# Patient Record
Sex: Female | Born: 1990 | Race: White | Hispanic: No | Marital: Married | Smoking: Never smoker
Health system: Southern US, Community
[De-identification: ages and names within clinical notes are randomized; demographics above are authoritative.]

## PROBLEM LIST (undated history)

## (undated) ENCOUNTER — Inpatient Hospital Stay (HOSPITAL_COMMUNITY): Payer: Self-pay

## (undated) DIAGNOSIS — Z789 Other specified health status: Secondary | ICD-10-CM

## (undated) HISTORY — PX: TONSILLECTOMY: SUR1361

## (undated) HISTORY — PX: DILATION AND CURETTAGE OF UTERUS: SHX78

---

## 2017-10-18 NOTE — L&D Delivery Note (Signed)
Delivery Note At 4:23 PM a viable female was delivered via VBAC, Spontaneous (Presentation: OA to ROT, compound R hand).  APGAR: 9, 9; weight  pending.   Placenta status: S/C/I, Tomasa BlaseSchultz, to patient per request .  Cord:  3VC with the following complications: none .  Cord blood collected for typing.  Anesthesia:  epidural Episiotomy: None Lacerations: Labial Suture Repair: 4.0 vycril Est. Blood Loss (mL): 300  Mom to postpartum.  Baby to Couplet care / Skin to Skin.  Neta MendsDaniela C Paul, CNM 10/04/2018, 4:47 PM

## 2018-03-08 ENCOUNTER — Ambulatory Visit: Payer: Self-pay

## 2018-05-22 LAB — OB RESULTS CONSOLE HIV ANTIBODY (ROUTINE TESTING): HIV: NONREACTIVE

## 2018-05-22 LAB — OB RESULTS CONSOLE GC/CHLAMYDIA
Chlamydia: NEGATIVE
Gonorrhea: NEGATIVE

## 2018-05-22 LAB — OB RESULTS CONSOLE RUBELLA ANTIBODY, IGM: Rubella: IMMUNE

## 2018-05-22 LAB — OB RESULTS CONSOLE HEPATITIS B SURFACE ANTIGEN: Hepatitis B Surface Ag: NEGATIVE

## 2018-05-22 LAB — OB RESULTS CONSOLE RPR: RPR: NONREACTIVE

## 2018-05-29 ENCOUNTER — Other Ambulatory Visit (HOSPITAL_COMMUNITY): Payer: Self-pay | Admitting: Certified Nurse Midwife

## 2018-05-29 DIAGNOSIS — Z3A22 22 weeks gestation of pregnancy: Secondary | ICD-10-CM

## 2018-05-29 DIAGNOSIS — Z3689 Encounter for other specified antenatal screening: Secondary | ICD-10-CM

## 2018-06-01 ENCOUNTER — Encounter (HOSPITAL_COMMUNITY): Payer: Self-pay | Admitting: *Deleted

## 2018-06-05 ENCOUNTER — Other Ambulatory Visit (HOSPITAL_COMMUNITY): Payer: Self-pay | Admitting: Certified Nurse Midwife

## 2018-06-05 ENCOUNTER — Ambulatory Visit (HOSPITAL_COMMUNITY)
Admission: RE | Admit: 2018-06-05 | Discharge: 2018-06-05 | Disposition: A | Discharge status: Home / Self Care | Payer: Medicaid Other | Source: Ambulatory Visit | Attending: Certified Nurse Midwife | Admitting: Certified Nurse Midwife

## 2018-06-05 DIAGNOSIS — Z3A22 22 weeks gestation of pregnancy: Secondary | ICD-10-CM

## 2018-06-05 DIAGNOSIS — O34219 Maternal care for unspecified type scar from previous cesarean delivery: Secondary | ICD-10-CM

## 2018-06-05 DIAGNOSIS — O359XX0 Maternal care for (suspected) fetal abnormality and damage, unspecified, not applicable or unspecified: Secondary | ICD-10-CM | POA: Insufficient documentation

## 2018-06-05 DIAGNOSIS — Z3689 Encounter for other specified antenatal screening: Secondary | ICD-10-CM

## 2018-06-05 DIAGNOSIS — Z363 Encounter for antenatal screening for malformations: Secondary | ICD-10-CM | POA: Diagnosis not present

## 2018-06-10 ENCOUNTER — Other Ambulatory Visit (HOSPITAL_COMMUNITY): Payer: Self-pay

## 2018-06-14 ENCOUNTER — Telehealth (HOSPITAL_COMMUNITY): Payer: Self-pay | Admitting: *Deleted

## 2018-07-31 ENCOUNTER — Other Ambulatory Visit (HOSPITAL_COMMUNITY): Payer: Self-pay | Admitting: Certified Nurse Midwife

## 2018-07-31 DIAGNOSIS — R1013 Epigastric pain: Secondary | ICD-10-CM

## 2018-08-02 ENCOUNTER — Ambulatory Visit (HOSPITAL_COMMUNITY)
Admission: RE | Admit: 2018-08-02 | Discharge: 2018-08-02 | Disposition: A | Discharge status: Home / Self Care | Payer: Medicaid Other | Source: Ambulatory Visit | Attending: Certified Nurse Midwife | Admitting: Certified Nurse Midwife

## 2018-08-02 DIAGNOSIS — Z3A30 30 weeks gestation of pregnancy: Secondary | ICD-10-CM | POA: Insufficient documentation

## 2018-08-02 DIAGNOSIS — R1011 Right upper quadrant pain: Secondary | ICD-10-CM | POA: Diagnosis present

## 2018-08-02 DIAGNOSIS — O9989 Other specified diseases and conditions complicating pregnancy, childbirth and the puerperium: Secondary | ICD-10-CM | POA: Insufficient documentation

## 2018-08-02 DIAGNOSIS — R1013 Epigastric pain: Secondary | ICD-10-CM

## 2018-08-02 DIAGNOSIS — N1330 Unspecified hydronephrosis: Secondary | ICD-10-CM | POA: Diagnosis not present

## 2018-08-02 DIAGNOSIS — O26893 Other specified pregnancy related conditions, third trimester: Secondary | ICD-10-CM | POA: Diagnosis present

## 2018-08-03 ENCOUNTER — Inpatient Hospital Stay (HOSPITAL_COMMUNITY): Payer: Medicaid Other

## 2018-08-03 ENCOUNTER — Inpatient Hospital Stay (HOSPITAL_BASED_OUTPATIENT_CLINIC_OR_DEPARTMENT_OTHER): Payer: Medicaid Other

## 2018-08-03 ENCOUNTER — Encounter (HOSPITAL_COMMUNITY): Payer: Self-pay | Admitting: *Deleted

## 2018-08-03 ENCOUNTER — Inpatient Hospital Stay (HOSPITAL_COMMUNITY)
Admission: AD | Admit: 2018-08-03 | Discharge: 2018-08-03 | Disposition: A | Discharge status: Home / Self Care | Payer: Medicaid Other | Source: Ambulatory Visit | Attending: Obstetrics and Gynecology | Admitting: Obstetrics and Gynecology

## 2018-08-03 DIAGNOSIS — Z3A31 31 weeks gestation of pregnancy: Secondary | ICD-10-CM | POA: Diagnosis not present

## 2018-08-03 DIAGNOSIS — O26893 Other specified pregnancy related conditions, third trimester: Secondary | ICD-10-CM | POA: Insufficient documentation

## 2018-08-03 DIAGNOSIS — Z363 Encounter for antenatal screening for malformations: Secondary | ICD-10-CM

## 2018-08-03 DIAGNOSIS — R1031 Right lower quadrant pain: Secondary | ICD-10-CM | POA: Diagnosis present

## 2018-08-03 DIAGNOSIS — O26899 Other specified pregnancy related conditions, unspecified trimester: Secondary | ICD-10-CM

## 2018-08-03 DIAGNOSIS — R109 Unspecified abdominal pain: Secondary | ICD-10-CM

## 2018-08-03 DIAGNOSIS — O359XX0 Maternal care for (suspected) fetal abnormality and damage, unspecified, not applicable or unspecified: Secondary | ICD-10-CM

## 2018-08-03 DIAGNOSIS — O34219 Maternal care for unspecified type scar from previous cesarean delivery: Secondary | ICD-10-CM | POA: Diagnosis not present

## 2018-08-03 HISTORY — DX: Other specified health status: Z78.9

## 2018-08-03 LAB — COMPREHENSIVE METABOLIC PANEL
ALT: 10 U/L (ref 0–44)
AST: 18 U/L (ref 15–41)
Albumin: 3.2 g/dL — ABNORMAL LOW (ref 3.5–5.0)
Alkaline Phosphatase: 58 U/L (ref 38–126)
Anion gap: 8 (ref 5–15)
BUN: 5 mg/dL — ABNORMAL LOW (ref 6–20)
CO2: 21 mmol/L — ABNORMAL LOW (ref 22–32)
Calcium: 8.6 mg/dL — ABNORMAL LOW (ref 8.9–10.3)
Chloride: 105 mmol/L (ref 98–111)
Creatinine, Ser: 0.4 mg/dL — ABNORMAL LOW (ref 0.44–1.00)
GFR calc Af Amer: >60 mL/min (ref >60)
GFR calc non Af Amer: >60 mL/min (ref >60)
Glucose, Bld: 84 mg/dL (ref 70–99)
Potassium: 4 mmol/L (ref 3.5–5.1)
Sodium: 134 mmol/L — ABNORMAL LOW (ref 135–145)
Total Bilirubin: 0.9 mg/dL (ref 0.3–1.2)
Total Protein: 6.6 g/dL (ref 6.5–8.1)

## 2018-08-03 LAB — CBC WITH DIFFERENTIAL/PLATELET
Basophils Absolute: 0 10*3/uL (ref 0.0–0.1)
Basophils Relative: 0 %
Eosinophils Absolute: 0 10*3/uL (ref 0.0–0.5)
Eosinophils Relative: 0 %
HCT: 32.8 % — ABNORMAL LOW (ref 36.0–46.0)
Hemoglobin: 11.5 g/dL — ABNORMAL LOW (ref 12.0–15.0)
Lymphocytes Relative: 12 %
Lymphs Abs: 1 10*3/uL (ref 0.7–4.0)
MCH: 31.7 pg (ref 26.0–34.0)
MCHC: 35.1 g/dL (ref 30.0–36.0)
MCV: 90.4 fL (ref 80.0–100.0)
Monocytes Absolute: 0.3 10*3/uL (ref 0.1–1.0)
Monocytes Relative: 3 %
Neutro Abs: 6.5 10*3/uL (ref 1.7–7.7)
Neutrophils Relative %: 85 %
Platelets: 208 10*3/uL (ref 150–400)
RBC: 3.63 MIL/uL — ABNORMAL LOW (ref 3.87–5.11)
RDW: 13.6 % (ref 11.5–15.5)
WBC: 7.7 10*3/uL (ref 4.0–10.5)
nRBC: 0 % (ref 0.0–0.2)

## 2018-08-03 MED ORDER — LACTATED RINGERS IV BOLUS
250.0000 mL | Freq: Once | INTRAVENOUS | Status: AC
  Administered 2018-08-03: 250 mL via INTRAVENOUS

## 2018-08-03 MED ORDER — NALBUPHINE HCL 10 MG/ML IJ SOLN
10.0000 mg | INTRAMUSCULAR | Status: AC
  Administered 2018-08-03: 10 mg via INTRAVENOUS
  Filled 2018-08-03: qty 1

## 2018-08-03 MED ORDER — LACTATED RINGERS IV SOLN
INTRAVENOUS | Status: DC
  Administered 2018-08-03: 13:00:00 via INTRAVENOUS

## 2018-08-03 MED ORDER — GI COCKTAIL ~~LOC~~
30.0000 mL | Freq: Once | ORAL | Status: AC
  Administered 2018-08-03: 30 mL via ORAL
  Filled 2018-08-03: qty 30

## 2018-08-03 NOTE — Discharge Instructions (Signed)

## 2018-08-03 NOTE — Progress Notes (Signed)
S:  no pain x 2 hours after IV meds while still       recurrence of intense pain with standing- walking - siting on toilet      no nausea, vomiting, epigastric pain        O:  VS: not documented by staff        FHR : baseline 135 / variability moderate / accelerations + / no decelerations        Toco: no UI / occasional rare ctx        Cervix : deferred        Membranes: intact        OB-sono: vtx / AFI 20 / normal placenta  (previous transverse lie last office visit)        Renal sono: right moderate hydronephrosis likely gravid uterus contributing - no back pain / no stones seen  A: 31 weeks abdominal pain - pain most consistent with ligament pain      no evidence of pre-term labor or abruption or gallbladder etiology today      FHR category 1 - OB sono normal AFI / vtx presentation      right hydronephrosis - pregnancy contributing factor / can get outpatient renal consult if pain persists      no evidence of appendicitis - cannot completely exclude but no clear indication for CT & risk 3rd trimester  P: DC home - expectant management / guarded condition      complete IVF and repeat Nubain 10IV prior to DC (mother to drive home and remain with her tonight)      recheck status in am - instruction for sleep with feet elevated to reduce ligament pain tonight      consider renal consult and/ or CT based on status in next 24 hours      Dr Billy Coast updated           Jennifer Dalton CNM, MSN, [facility] 08/03/2018, 2:42 PM

## 2018-08-03 NOTE — MAU Provider Note (Signed)
History     CSN: [id number]  Arrival date and time: 08/03/18 1110 TC to provider @ 1155am Provider in room to evaluate @ 12:10   HPI  Acute abdominal pain over entire abdomen today that hurts to touch her belly. States dropped to floor trying to walk due to severe pain right side and lower abdomen when stood up this am. Reports fetal movement and position changes hurt -only place comfortable lying down & pain stops with still. No nausea or vomiting with normal BM today. Normal low fat diet intake without symptoms. Spouse out of town - called EMS for transport due to pain with walking  Monday: Intermittent epigastric pain - seen for possible acute gallbladder pain onset after 2 hours high fat food intake - epigastric radiating to back unable to lie down & Tums did not resolve (+) nausea but no vomiting reports normal BM - no diarrhea or heartburn over weekend.  Seen and examined and additional labs with mildly elevated amylase with NL LE and lipase         scheduled for outpt GI sono 10/16:  normal upper GI sono yesterday with mild right pylectasis   Past Medical History:  Diagnosis Date  . Medical history non-contributory     Past Surgical History:  Procedure Laterality Date  . DILATION AND CURETTAGE OF UTERUS    . TONSILLECTOMY      No family history on file.  Social History   Tobacco Use  . Smoking status: Never Smoker  . Smokeless tobacco: Never Used  Substance Use Topics  . Alcohol use: Not Currently  . Drug use: Never    Allergies: No Known Allergies  No medications prior to admission.    Review of Systems Physical Exam   Blood pressure 130/61, pulse (!) 105, temperature 98.1 F (36.7 C), resp. rate 18, last menstrual period 12/29/2017.  Physical Exam  Alert and oriented - acute pain and tearful in bed Heart RRR with mild tachycardia Lungs clear Abdomen (+) BS / gravid uterus - non-tender but displacement of uterus causes generalized abdominal  pain / no specific rebound / generalized tenderness to abdomen but increased over right lower quadrant. Defer VE - no discharge / no ctx / normal FM CVA - negative bilateral Extremities - no edema or pain  FHR - reactive NST with category 1 tracing Toco occasional ctx   Labs:  9/26 WBC 8.0  and 10/15 WBC 10.3  - Today WBC 7.7  10/15  LE 11/14 and amylase 37 - today LE 18/10  MAU Course  Procedures  Renal sono to evaluate hydronephrosis and nephrolithiasis Ob sono for presentation / AFI may need CT to evaluate appendix with increased WBC or worsening symptoms with other negative testing  Will consult with MD after initial labs & imaging resulted / reviewed   Assessment and Plan  [redacted] weeks gestation with abdominal pain - diffuse with increase right lower quadrant pain recent gallbladder symptoms (Monday)            - GI sono NL without stones / normal liver functions with mildly elevated amylase  Etiology of pain:    - renal: evaluate for hydronephrosis & nephrolithiasis with sono / negative flank pain and clear urine   - cannot exclude appendix - afebrile with normal WBC and normal bowel function   - possible ligament pain from fetal presentation or change in presentation (transverse earlier in week)  Orders: CBC with diff / CMP Renal sono  OB sono AFI  and presentation IVF for hydration GI cocktail and Nubain for pain  Marlinda Mike 08/03/2018, 12:19 PM

## 2018-08-03 NOTE — MAU Note (Signed)
Pt arrived EMS with diffuse abd pain that has gotten worse over the past few days. Abd is tender to touch. FHR WNL at this time.

## 2018-09-04 LAB — OB RESULTS CONSOLE GBS: GBS: NEGATIVE

## 2018-10-04 ENCOUNTER — Inpatient Hospital Stay (HOSPITAL_COMMUNITY): Payer: Medicaid Other | Admitting: Anesthesiology

## 2018-10-04 ENCOUNTER — Inpatient Hospital Stay (HOSPITAL_COMMUNITY)
Admission: AD | Admit: 2018-10-04 | Discharge: 2018-10-05 | DRG: 807 | Disposition: A | Discharge status: Home / Self Care | Payer: Medicaid Other | Attending: Obstetrics and Gynecology | Admitting: Obstetrics and Gynecology

## 2018-10-04 ENCOUNTER — Other Ambulatory Visit: Payer: Self-pay

## 2018-10-04 ENCOUNTER — Encounter (HOSPITAL_COMMUNITY): Payer: Self-pay

## 2018-10-04 DIAGNOSIS — O9902 Anemia complicating childbirth: Secondary | ICD-10-CM | POA: Diagnosis present

## 2018-10-04 DIAGNOSIS — Z3A39 39 weeks gestation of pregnancy: Secondary | ICD-10-CM | POA: Diagnosis not present

## 2018-10-04 DIAGNOSIS — O34219 Maternal care for unspecified type scar from previous cesarean delivery: Principal | ICD-10-CM | POA: Diagnosis present

## 2018-10-04 DIAGNOSIS — Z3483 Encounter for supervision of other normal pregnancy, third trimester: Secondary | ICD-10-CM | POA: Diagnosis present

## 2018-10-04 DIAGNOSIS — D649 Anemia, unspecified: Secondary | ICD-10-CM | POA: Diagnosis present

## 2018-10-04 DIAGNOSIS — Z3490 Encounter for supervision of normal pregnancy, unspecified, unspecified trimester: Secondary | ICD-10-CM

## 2018-10-04 LAB — CBC
HCT: 35.1 % — ABNORMAL LOW (ref 36.0–46.0)
Hemoglobin: 11.8 g/dL — ABNORMAL LOW (ref 12.0–15.0)
MCH: 31.1 pg (ref 26.0–34.0)
MCHC: 33.6 g/dL (ref 30.0–36.0)
MCV: 92.4 fL (ref 80.0–100.0)
NRBC: 0 % (ref 0.0–0.2)
Platelets: 186 10*3/uL (ref 150–400)
RBC: 3.8 MIL/uL — ABNORMAL LOW (ref 3.87–5.11)
RDW: 13.5 % (ref 11.5–15.5)
WBC: 9.6 10*3/uL (ref 4.0–10.5)

## 2018-10-04 LAB — ABO/RH: ABO/RH(D): A POS

## 2018-10-04 LAB — TYPE AND SCREEN
ABO/RH(D): A POS
Antibody Screen: NEGATIVE

## 2018-10-04 MED ORDER — OXYTOCIN 40 UNITS IN LACTATED RINGERS INFUSION - SIMPLE MED
2.5000 [IU]/h | INTRAVENOUS | Status: DC
  Filled 2018-10-04: qty 1000

## 2018-10-04 MED ORDER — DIPHENHYDRAMINE HCL 50 MG/ML IJ SOLN: 12.5000 mg | INTRAMUSCULAR | Status: DC | PRN

## 2018-10-04 MED ORDER — WITCH HAZEL-GLYCERIN EX PADS: 1.0000 "application " | MEDICATED_PAD | CUTANEOUS | Status: DC | PRN

## 2018-10-04 MED ORDER — DOCUSATE SODIUM 100 MG PO CAPS
100.0000 mg | ORAL_CAPSULE | Freq: Two times a day (BID) | ORAL | Status: DC
  Administered 2018-10-05: 100 mg via ORAL
  Filled 2018-10-04: qty 1

## 2018-10-04 MED ORDER — FENTANYL 2.5 MCG/ML BUPIVACAINE 1/10 % EPIDURAL INFUSION (WH - ANES)
14.0000 mL/h | INTRAMUSCULAR | Status: DC | PRN
  Administered 2018-10-04: 14 mL/h via EPIDURAL
  Filled 2018-10-04: qty 100

## 2018-10-04 MED ORDER — IBUPROFEN 600 MG PO TABS
600.0000 mg | ORAL_TABLET | Freq: Four times a day (QID) | ORAL | Status: DC
  Administered 2018-10-04 – 2018-10-05 (×4): 600 mg via ORAL
  Filled 2018-10-04 (×4): qty 1

## 2018-10-04 MED ORDER — TETANUS-DIPHTH-ACELL PERTUSSIS 5-2.5-18.5 LF-MCG/0.5 IM SUSP: 0.5000 mL | Freq: Once | INTRAMUSCULAR | Status: DC

## 2018-10-04 MED ORDER — DIBUCAINE 1 % RE OINT: 1.0000 "application " | TOPICAL_OINTMENT | RECTAL | Status: DC | PRN

## 2018-10-04 MED ORDER — PRENATAL MULTIVITAMIN CH
1.0000 | ORAL_TABLET | Freq: Every day | ORAL | Status: DC
  Administered 2018-10-05: 1 via ORAL
  Filled 2018-10-04: qty 1

## 2018-10-04 MED ORDER — DIPHENHYDRAMINE HCL 25 MG PO CAPS: 25.0000 mg | ORAL_CAPSULE | Freq: Four times a day (QID) | ORAL | Status: DC | PRN

## 2018-10-04 MED ORDER — ONDANSETRON HCL 4 MG PO TABS: 4.0000 mg | ORAL_TABLET | ORAL | Status: DC | PRN

## 2018-10-04 MED ORDER — SOD CITRATE-CITRIC ACID 500-334 MG/5ML PO SOLN: 30.0000 mL | ORAL | Status: DC | PRN

## 2018-10-04 MED ORDER — BENZOCAINE-MENTHOL 20-0.5 % EX AERO
1.0000 "application " | INHALATION_SPRAY | CUTANEOUS | Status: DC | PRN
  Administered 2018-10-04: 1 via TOPICAL
  Filled 2018-10-04: qty 56

## 2018-10-04 MED ORDER — LACTATED RINGERS IV SOLN: 500.0000 mL | Freq: Once | INTRAVENOUS | Status: DC

## 2018-10-04 MED ORDER — OXYCODONE-ACETAMINOPHEN 5-325 MG PO TABS: 1.0000 | ORAL_TABLET | ORAL | Status: DC | PRN

## 2018-10-04 MED ORDER — OXYCODONE HCL 5 MG PO TABS: 5.0000 mg | ORAL_TABLET | ORAL | Status: DC | PRN

## 2018-10-04 MED ORDER — LACTATED RINGERS IV SOLN
INTRAVENOUS | Status: DC
  Administered 2018-10-04: 14:00:00 via INTRAVENOUS

## 2018-10-04 MED ORDER — FLEET ENEMA 7-19 GM/118ML RE ENEM: 1.0000 | ENEMA | Freq: Every day | RECTAL | Status: DC | PRN

## 2018-10-04 MED ORDER — EPHEDRINE 5 MG/ML INJ
10.0000 mg | INTRAVENOUS | Status: DC | PRN
  Filled 2018-10-04: qty 2

## 2018-10-04 MED ORDER — BISACODYL 10 MG RE SUPP: 10.0000 mg | Freq: Every day | RECTAL | Status: DC | PRN

## 2018-10-04 MED ORDER — ONDANSETRON HCL 4 MG/2ML IJ SOLN: 4.0000 mg | INTRAMUSCULAR | Status: DC | PRN

## 2018-10-04 MED ORDER — PHENYLEPHRINE 40 MCG/ML (10ML) SYRINGE FOR IV PUSH (FOR BLOOD PRESSURE SUPPORT)
80.0000 ug | PREFILLED_SYRINGE | INTRAVENOUS | Status: DC | PRN
  Filled 2018-10-04 (×2): qty 10

## 2018-10-04 MED ORDER — ACETAMINOPHEN 325 MG PO TABS: 650.0000 mg | ORAL_TABLET | ORAL | Status: DC | PRN

## 2018-10-04 MED ORDER — ONDANSETRON HCL 4 MG/2ML IJ SOLN: 4.0000 mg | Freq: Four times a day (QID) | INTRAMUSCULAR | Status: DC | PRN

## 2018-10-04 MED ORDER — OXYTOCIN BOLUS FROM INFUSION
500.0000 mL | Freq: Once | INTRAVENOUS | Status: AC
  Administered 2018-10-04: 500 mL via INTRAVENOUS

## 2018-10-04 MED ORDER — LIDOCAINE HCL (PF) 1 % IJ SOLN
INTRAMUSCULAR | Status: DC | PRN
  Administered 2018-10-04 (×2): 4 mL via EPIDURAL

## 2018-10-04 MED ORDER — COCONUT OIL OIL: 1.0000 "application " | TOPICAL_OIL | Status: DC | PRN

## 2018-10-04 MED ORDER — PHENYLEPHRINE 40 MCG/ML (10ML) SYRINGE FOR IV PUSH (FOR BLOOD PRESSURE SUPPORT)
80.0000 ug | PREFILLED_SYRINGE | INTRAVENOUS | Status: DC | PRN
  Filled 2018-10-04: qty 10

## 2018-10-04 MED ORDER — SIMETHICONE 80 MG PO CHEW
80.0000 mg | CHEWABLE_TABLET | ORAL | Status: DC | PRN
  Administered 2018-10-05: 80 mg via ORAL
  Filled 2018-10-04: qty 1

## 2018-10-04 MED ORDER — OXYCODONE-ACETAMINOPHEN 5-325 MG PO TABS: 2.0000 | ORAL_TABLET | ORAL | Status: DC | PRN

## 2018-10-04 MED ORDER — LIDOCAINE HCL (PF) 1 % IJ SOLN
30.0000 mL | INTRAMUSCULAR | Status: DC | PRN
  Filled 2018-10-04: qty 30

## 2018-10-04 NOTE — H&P (Signed)
OB ADMISSION/ HISTORY & PHYSICAL:  Admission Date: 10/04/2018  7:57 AM  Admit Diagnosis: 40wks induction    Jennifer Dalton is a 27 y.o. female presenting for IOL, elective, wants to deliver while spouse is in town. Cervical balloon in office yesterday, expelled after 4 hours, patient slept overnight, occasional ctx.  Took one dose castor oil this am.  Doing well, no NVD, comfortable.   Prenatal History: [id number]   EDC : 10/05/2018, by Last Menstrual Period  [facility] since 20 wks, [place]  Prenatal course complicated by: Hx macrosomia (9'15) then 1C/S for suspect macrosomia (8'13) Abdominal pain at 35 wks, suspect gall bladder colic, labs and sono benign, no further episodes with change in diet.   Prenatal Labs: ABO, Rh:   A pos Antibody:  neg Rubella:   immune RPR:   NR HBsAg:   NR HIV:   NR GBS:   neg 1 hr Glucola : wnl Genetic Screening: NIPS normal Ultrasound: normal anatomy, undisclosed gender, EIF, anterior placenta  Medical / Surgical History :  Past medical history:  Past Medical History:  Diagnosis Date  . Medical history non-contributory      Past surgical history:  Past Surgical History:  Procedure Laterality Date  . CESAREAN SECTION    . DILATION AND CURETTAGE OF UTERUS    . TONSILLECTOMY       Family History: History reviewed. No pertinent family history.   Social History:  reports that she has never smoked. She has never used smokeless tobacco. She reports previous alcohol use. She reports that she does not use drugs.   Allergies: Patient has no known allergies.   Current Medications at time of admission:  Medications Prior to Admission  Medication Sig Dispense Refill Last Dose  . OVER THE COUNTER MEDICATION Take 3 tablets by mouth daily. [employer] My Kind Prenatal Vitamin   10/04/2018 at Unknown time     Review of Systems: ROS  As noted above  Physical Exam: Vital signs and nursing  notes reviewed.  ED Triage Vitals  Enc Vitals Group     BP 10/04/18 0820 122/65     Pulse Rate 10/04/18 0820 (!) 124     Resp 10/04/18 0820 18     Temp --      Temp src --      SpO2 --      Weight 10/04/18 0857 183 lb 6.4 oz (83.2 kg)     Height 10/04/18 0857 5\' 7"  (1.702 m)     Head Circumference --      Peak Flow --      Pain Score --      Pain Loc --      Pain Edu? --      Excl. in GC? --      General: AAO x 3, NAD, coping well Heart: RRR Lungs:CTAB Abdomen: Gravid, NT, Leopold's vtx, EFW 8 lbs Extremities: no edema Genitalia / VE: Dilation: 5 Effacement (%): 90 Presentation: Vertex Exam by:: D.  CNM  AROM, light MSAF FHR:  140 BPM, mod variability, + accels, no decels TOCO: Ctx q occasional  Labs:   Pending T&S, CBC, RPR  Recent Labs    10/04/18 0859  WBC 9.6  HGB 11.8*  HCT 35.1*  PLT 186       Assessment:  27 y.o. [id number] at 1290w6d, TOLAC, elective IOL HX LGA, pelvis proven to 9'15"   1. S/p cervical balloon outpatient, favorable cvx 2.  FHR category 1 3. GBS neg 4. Desires natural labor, minimal intervention 5. Breastfeeding 6. Placenta disposal per patient request  Plan:  1. Admit to BS, AROM augmentation, plan Pitocin if no labor in next couple hours 2. Routine L&D orders 3. Nitrous PRN  4. Expectant management 5. Anticipate NSVB   Dr Billy Coast notified of admission / plan of care   Neta Mends CNM, MSN 10/04/2018, 9:47 AM

## 2018-10-04 NOTE — Anesthesia Procedure Notes (Signed)
Epidural Patient location during procedure: OB Start time: 10/04/2018 2:48 PM End time: 10/04/2018 2:57 PM  Staffing Anesthesiologist: Mal AmabileFoster, Vicki Chaffin, MD Performed: anesthesiologist   Preanesthetic Checklist Completed: patient identified, site marked, surgical consent, pre-op evaluation, timeout performed, IV checked, risks and benefits discussed and monitors and equipment checked  Epidural Patient position: sitting Prep: site prepped and draped and DuraPrep Patient monitoring: continuous pulse ox and blood pressure Approach: midline Location: L3-L4 Injection technique: LOR air  Needle:  Needle type: Tuohy  Needle gauge: 17 G Needle length: 9 cm and 9 Needle insertion depth: 4 cm Catheter type: closed end flexible Catheter size: 19 Gauge Catheter at skin depth: 9 cm Test dose: negative and Other  Assessment Events: blood not aspirated, injection not painful, no injection resistance, negative IV test and no paresthesia  Additional Notes Patient identified. Risks and benefits discussed including failed block, incomplete  Pain control, post dural puncture headache, nerve damage, paralysis, blood pressure Changes, nausea, vomiting, reactions to medications-both toxic and allergic and post Partum back pain. All questions were answered. Patient expressed understanding and wished to proceed. Sterile technique was used throughout procedure. Epidural site was Dressed with sterile barrier dressing. No paresthesias, signs of intravascular injection Or signs of intrathecal spread were encountered.  Patient was more comfortable after the epidural was dosed. Please see RN's note for documentation of vital signs and FHR which are stable. Reason for block:procedure for pain

## 2018-10-04 NOTE — Anesthesia Preprocedure Evaluation (Signed)
Anesthesia Evaluation  Patient identified by MRN, date of birth, ID band Patient awake    Reviewed: Allergy & Precautions, Patient's Chart, lab work & pertinent test results  Airway Mallampati: II  TM Distance: >3 FB Neck ROM: Full    Dental no notable dental hx. (+) Teeth Intact   Pulmonary neg pulmonary ROS,    Pulmonary exam normal breath sounds clear to auscultation       Cardiovascular negative cardio ROS Normal cardiovascular exam Rhythm:Regular Rate:Normal     Neuro/Psych negative neurological ROS     GI/Hepatic Neg liver ROS, GERD  ,  Endo/Other  negative endocrine ROS  Renal/GU negative Renal ROS     Musculoskeletal   Abdominal   Peds  Hematology  (+) anemia ,   Anesthesia Other Findings   Reproductive/Obstetrics (+) Pregnancy Previous C/section                             Anesthesia Physical Anesthesia Plan  ASA: II  Anesthesia Plan: Epidural   Post-op Pain Management:    Induction:   PONV Risk Score and Plan:   Airway Management Planned: Natural Airway  Additional Equipment:   Intra-op Plan:   Post-operative Plan:   Informed Consent: I have reviewed the patients History and Physical, chart, labs and discussed the procedure including the risks, benefits and alternatives for the proposed anesthesia with the patient or authorized representative who has indicated his/her understanding and acceptance.     Plan Discussed with: Anesthesiologist  Anesthesia Plan Comments:         Anesthesia Quick Evaluation

## 2018-10-04 NOTE — Progress Notes (Signed)
Patient ID: Jennifer Dalton Sahagun, female   DOB: 01/26/1991, 27 y.o.   MRN: [id number] Jennifer Dalton Whatley is a 27 y.o. [id number] at 4134w6d by LMP admitted for IOL/TOLAC at term, favorable.  Subjective:  Active labor started after AROM, has had BM x 4, no N/V. Used position changes and birthing ball, nitrous gas briefly, just completed epidural. Getting more comfortable but still feeling a lot of pressure.   Objective: Vitals:   10/04/18 1505 10/04/18 1510 10/04/18 1515 10/04/18 1520  BP: 113/66 116/75 108/70 112/65  Pulse: 88 81 93 84  Resp: 20  18 18   Temp:      TempSrc:      SpO2: 100% 100% 100% 100%  Weight:      Height:        FHT:  FHR: 130 bpm, variability: moderate,  accelerations:  Present,  decelerations:  Present some early UC:   q 2-4 min, mod to palp SVE:   Dilation: 9 Effacement (%): 90 Station: -1 Exam by:: Colon Flattery. Luverta Korte CNM Needs to rotate, asynclitic   Labs:   Recent Labs    10/04/18 0859  WBC 9.6  HGB 11.8*  HCT 35.1*  PLT 186    Assessment / Plan: Spontaneous labor, progressing normally  TOLAC  Labor: Anticipate active second stage shortly Preeclampsia:  no s/sx Fetal Wellbeing:  Category I Pain Control:  Epidural and suboptimal pain cotrol I/D:  GBS neg Anticipated MOD:  VBAC  Neta Mendsaniela C Takeem Krotzer, CNM, MSN 10/04/2018, 3:27 PM

## 2018-10-04 NOTE — Anesthesia Pain Management Evaluation Note (Signed)
  CRNA Pain Management Visit Note  Patient: Randa EvensCarissa Roscoe, 27 y.o., female  "Hello I am a member of the anesthesia team at [facility]. We have an anesthesia team available at all times to provide care throughout the hospital, including epidural management and anesthesia for C-section. I don't know your plan for the delivery whether it a natural birth, water birth, IV sedation, nitrous supplementation, doula or epidural, but we want to meet your pain goals."   1.Was your pain managed to your expectations on prior hospitalizations?   Yes   2.What is your expectation for pain management during this hospitalization?     Labor support without medications  3.How can we help you reach that goal? Natural or IV meds as requested  Record the patient's initial score and the patient's pain goal.   Pain: 4  Pain Goal: 10 [facility] wants you to be able to say your pain was always managed very well.  Cleda ClarksBrowder, Natascha Edmonds R 10/04/2018

## 2018-10-05 LAB — CBC
HCT: 33.7 % — ABNORMAL LOW (ref 36.0–46.0)
Hemoglobin: 11.4 g/dL — ABNORMAL LOW (ref 12.0–15.0)
MCH: 31.2 pg (ref 26.0–34.0)
MCHC: 33.8 g/dL (ref 30.0–36.0)
MCV: 92.3 fL (ref 80.0–100.0)
Platelets: 214 10*3/uL (ref 150–400)
RBC: 3.65 MIL/uL — ABNORMAL LOW (ref 3.87–5.11)
RDW: 13.7 % (ref 11.5–15.5)
WBC: 13.6 10*3/uL — ABNORMAL HIGH (ref 4.0–10.5)
nRBC: 0 % (ref 0.0–0.2)

## 2018-10-05 LAB — RPR: RPR Ser Ql: NONREACTIVE

## 2018-10-05 MED ORDER — IBUPROFEN 600 MG PO TABS: 600.0000 mg | ORAL_TABLET | Freq: Four times a day (QID) | ORAL | 0 refills | Status: AC

## 2018-10-05 NOTE — Progress Notes (Signed)
PPD 1 SVD with VBAC  S:  Reports feeling well - desires to go home             Tolerating po/ No nausea or vomiting             Bleeding is light             Pain controlled with motrin             Up ad lib / ambulatory / voiding QS  Newborn Breast  O:            VS: BP 114/73 (BP Location: Left Arm)   Pulse 79   Temp 97.7 F (36.5 C) (Oral)   Resp 17   Ht 5\' 7"  (1.702 m)   Wt 83.2 kg   LMP 12/29/2017   SpO2 100%   Breastfeeding Unknown   BMI 28.72 kg/m    LABS:             Recent Labs    10/04/18 0859 10/05/18 0612  WBC 9.6 13.6*  HGB 11.8* 11.4*  PLT 186 214               Blood type: --/--/A POS, A POS Performed at [facility], [address] [place], [place] [address]  [phone](12/18 [phone])  Rubella: Immune (08/05 0000)                    Physical Exam:             Alert and oriented X3             Fundus: firm, non-tender, Ueven  Perineum: intact  Lochia: light  Extremities: no edema, no calf pain or tenderness    A: PPD # 1 VBAC  Doing well - stable status  P: Routine post partum orders  DC home  Marlinda Mikeanya Yonah Tangeman CNM, MSN, [facility] 10/05/2018, 6:52 PM

## 2018-10-05 NOTE — Anesthesia Postprocedure Evaluation (Signed)
Anesthesia Post Note  Patient: Randa Evensarissa Demaree  Procedure(s) Performed: AN AD HOC LABOR EPIDURAL     Patient location during evaluation: Mother Baby Anesthesia Type: Epidural Level of consciousness: awake and alert Pain management: pain level controlled Vital Signs Assessment: post-procedure vital signs reviewed and stable Respiratory status: spontaneous breathing, nonlabored ventilation and respiratory function stable Cardiovascular status: stable Postop Assessment: no headache, no backache and epidural receding Anesthetic complications: no    Last Vitals:  Vitals:   10/05/18 0406 10/05/18 0524  BP: 111/65 117/68  Pulse: 71 67  Resp: 16 16  Temp: 36.7 C 36.6 C  SpO2: 100% 100%    Last Pain:  Vitals:   10/05/18 0524  TempSrc: Oral  PainSc:    Pain Goal:                 Junious SilkGILBERT,Blade Scheff

## 2018-10-05 NOTE — Discharge Summary (Signed)
Obstetric Discharge Summary Reason for Admission: induction of labor Prenatal Procedures: none Intrapartum Procedures: spontaneous vaginal delivery Postpartum Procedures: none Complications-Operative and Postpartum: none Hemoglobin  Date Value Ref Range Status  10/05/2018 11.4 (L) 12.0 - 15.0 g/dL Final   HCT  Date Value Ref Range Status  10/05/2018 33.7 (L) 36.0 - 46.0 % Final    Physical Exam:  General: alert, cooperative and no distress Lochia: appropriate Uterine Fundus: firm DVT Evaluation: No evidence of DVT seen on physical exam.  Discharge Diagnoses: Term Pregnancy-delivered and VBAC  Discharge Information: Date: 10/05/2018 Activity: pelvic rest Diet: routine Medications: PNV and Ibuprofen Condition: stable Instructions: refer to practice specific booklet Discharge to: home Follow-up Information    Neta Mendsaul, Daniela C, CNM. Schedule an appointment as soon as possible for a visit in 2 week(s).   Specialty:  Obstetrics and Gynecology Contact information: [address] [place] [address] [phone]           Newborn Data: Live born female  Birth Weight: 8 lb 8.2 oz (3861 g) APGAR: 9, 9  Newborn Delivery   Birth date/time:  10/04/2018 16:23:00 Delivery type:  VBAC, Spontaneous     Home with mother.  Jennifer Dalton 10/05/2018, 6:54 PM

## 2020-05-29 IMAGING — US US ABDOMEN LIMITED
1 series · 14 of 25 positions shown · non-contrast
Comparison: None.

CLINICAL DATA: Abdominal pain, 31 weeks pregnant

EXAM:
ULTRASOUND ABDOMEN LIMITED RIGHT UPPER QUADRANT

[Series 1: us abdomen limited · 14 of 46 slices shown]
[im 1/46]
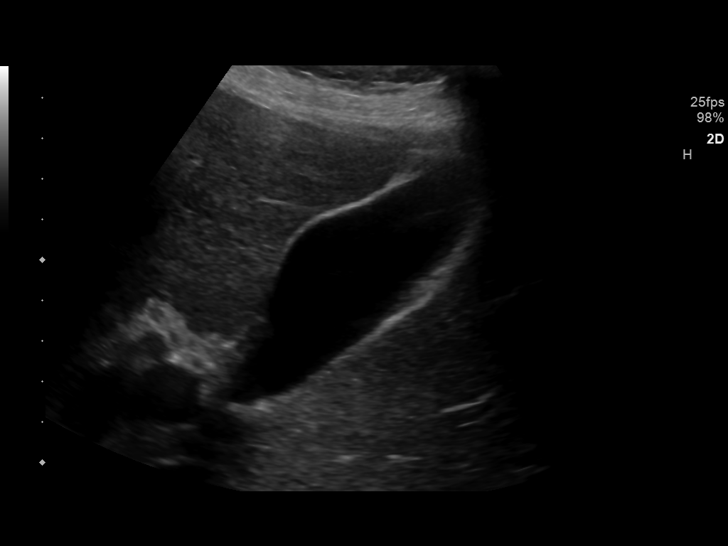
[im 4/46]
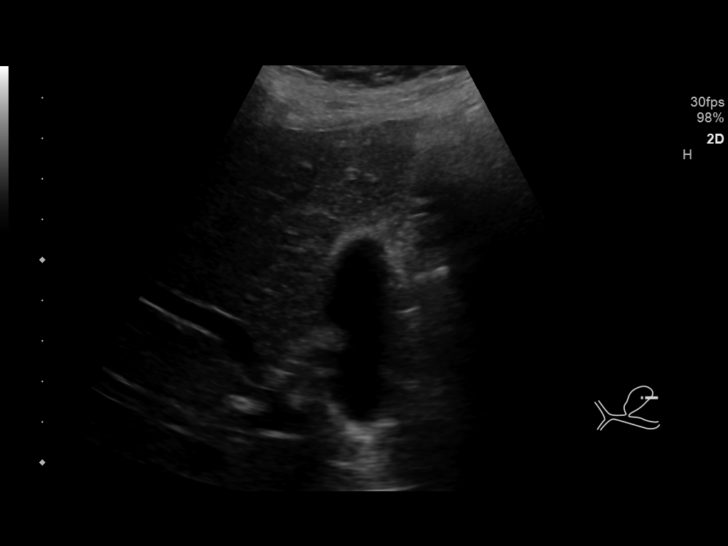
[im 8/46]
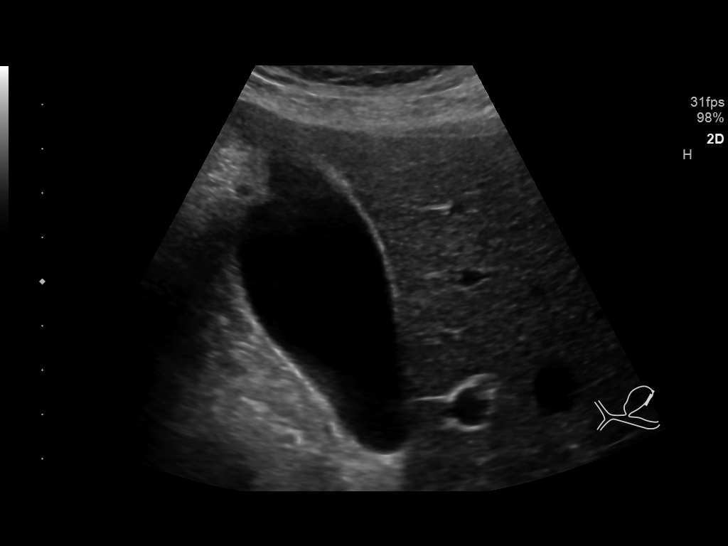
[im 12/46]
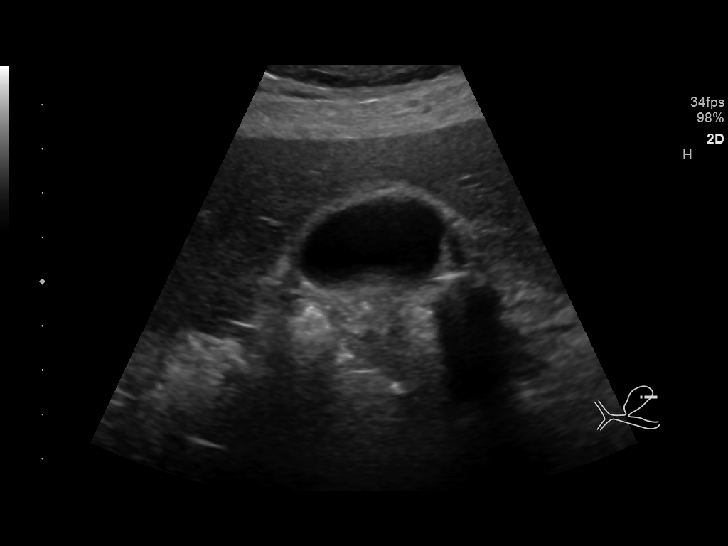
[im 16/46]
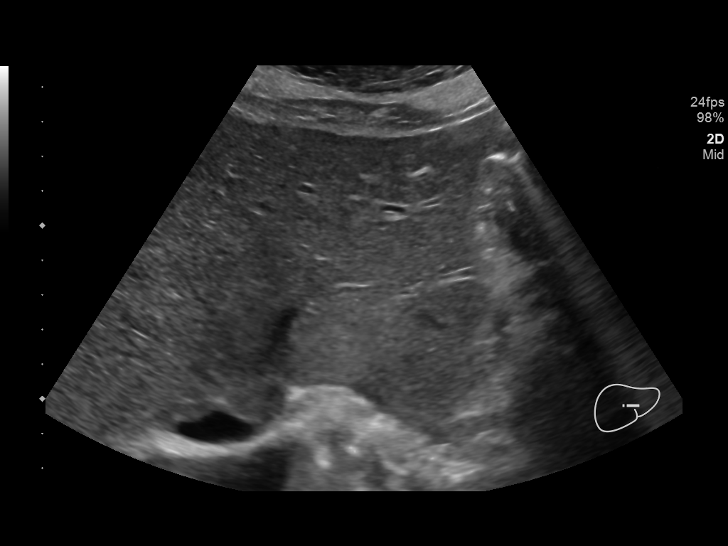
[im 17/46]
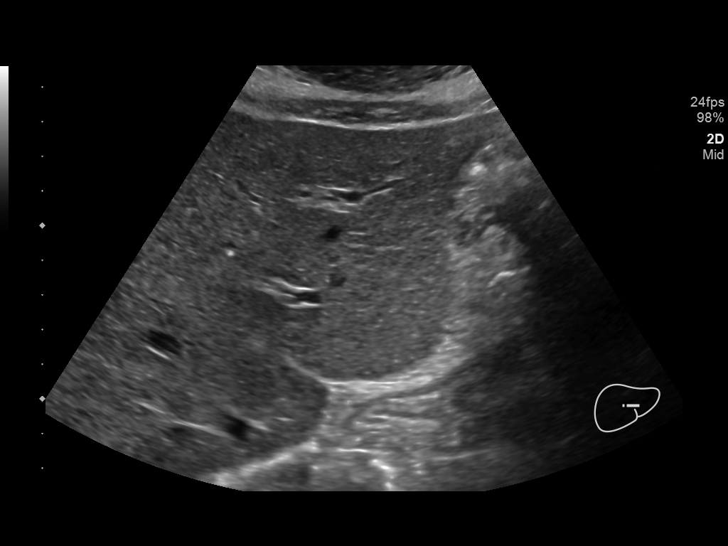
[im 21/46]
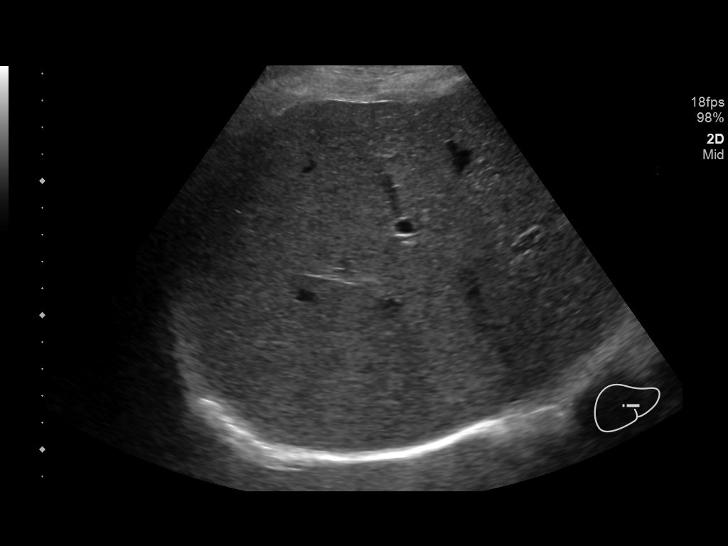
[im 25/46]
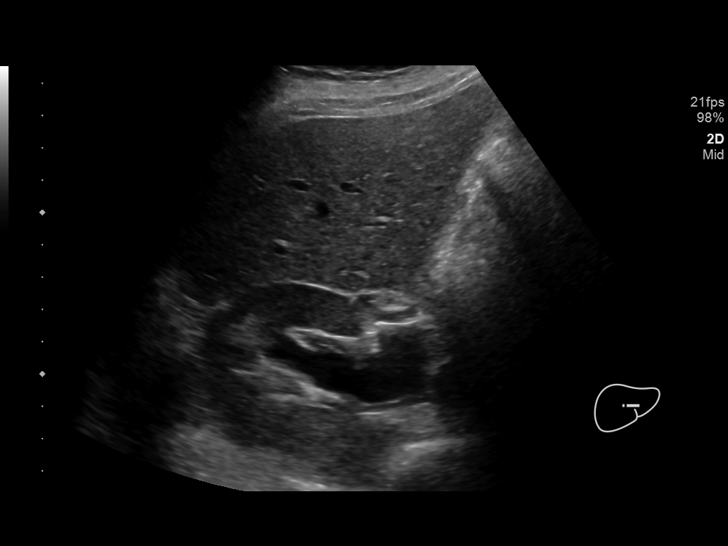
[im 29/46]
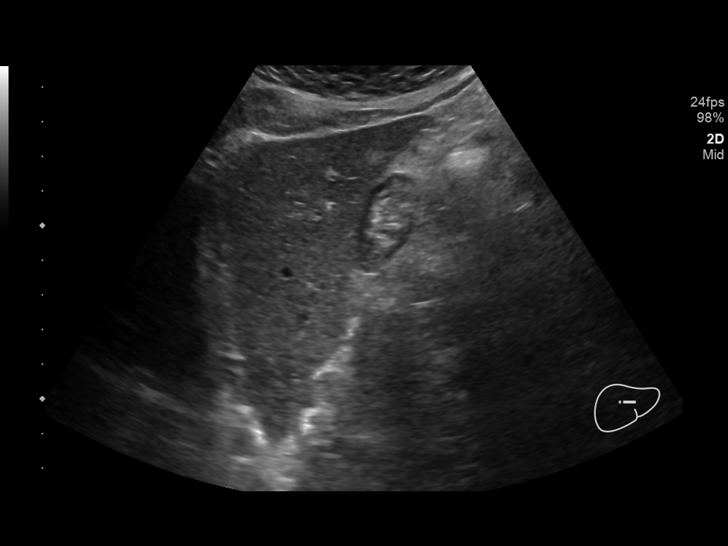
[im 31/46]
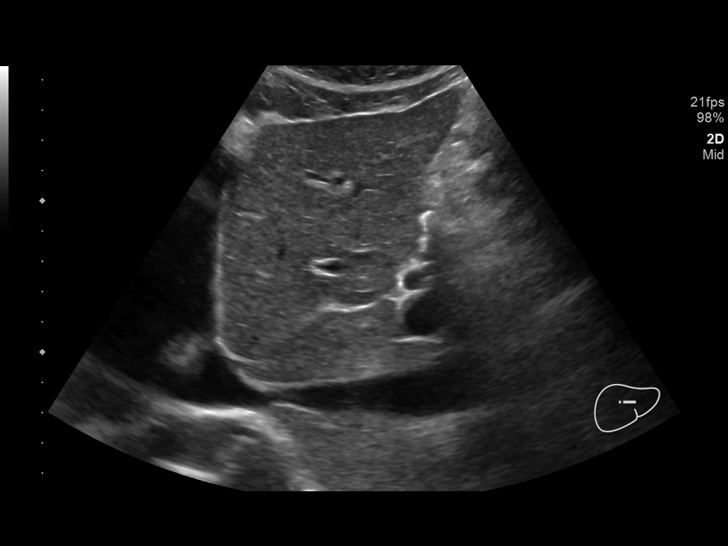
[im 34/46]
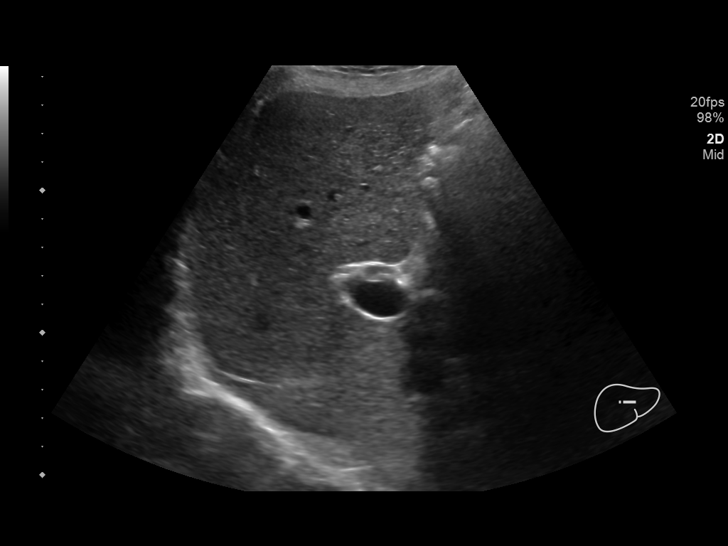
[im 38/46]
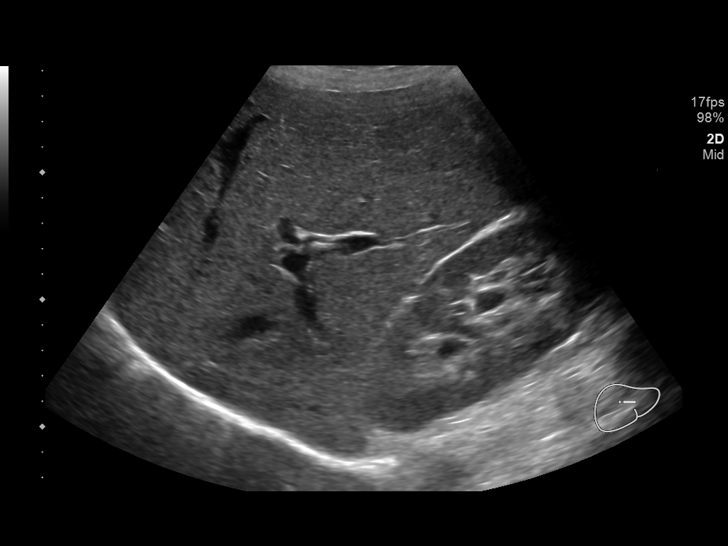
[im 42/46]
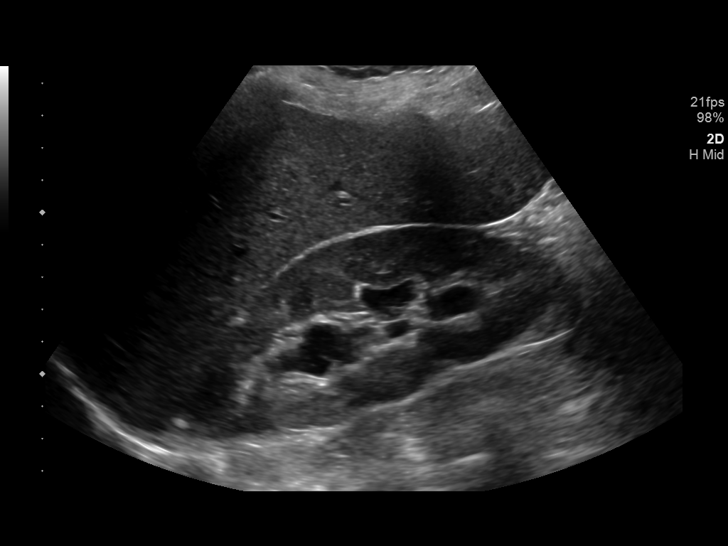
[im 46/46]
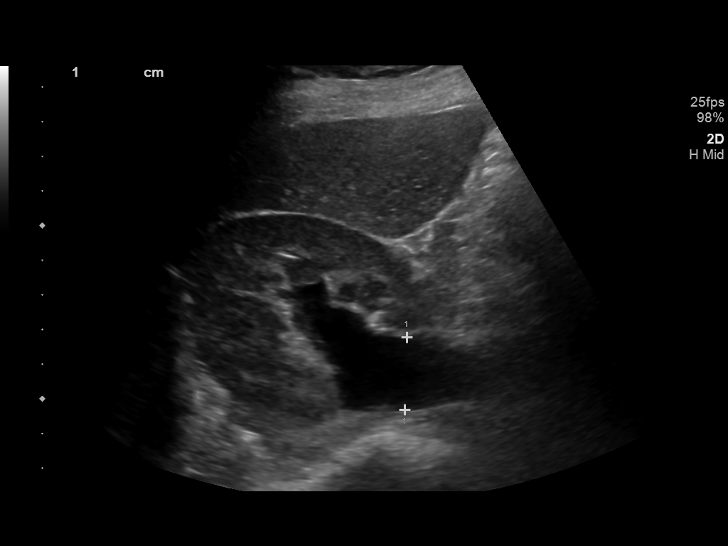

[14 of 25 positions shown; findings below may reference images not displayed]

FINDINGS: Gallbladder:

No gallstones or wall thickening visualized. No sonographic Murphy
sign noted by sonographer.

Common bile duct:

Diameter: Normal caliber, 3 mm

Liver:

No focal lesion identified. Within normal limits in parenchymal
echogenicity. Portal vein is patent on color Doppler imaging with
normal direction of blood flow towards the liver.

Incidentally noted is hydronephrosis within the right kidney, likely
related to pregnancy.
IMPRESSION: Right side hydronephrosis, likely related to advanced pregnancy.
Otherwise unremarkable right upper quadrant ultrasound.

## 2020-05-30 IMAGING — US US RENAL
1 series · 15 of 25 positions shown · non-contrast
Comparison: 08/02/2018

CLINICAL DATA: Abdominal pain for the past few days

EXAM:
RENAL / URINARY TRACT ULTRASOUND COMPLETE

[Series 2: us renal · 15 of 48 slices shown]
[im 1/48]
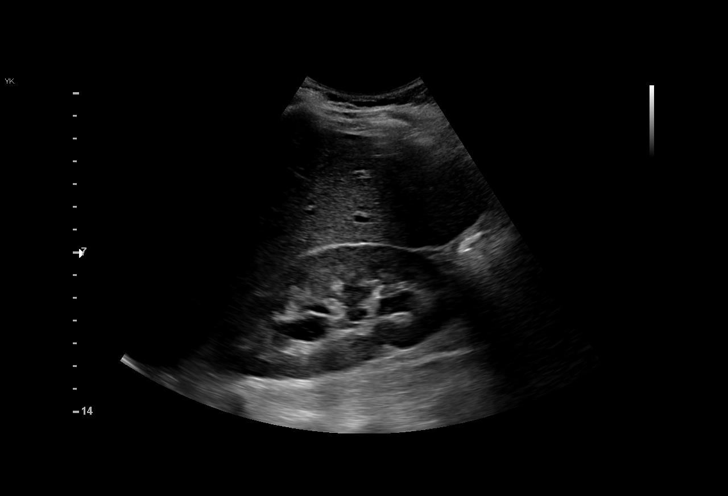
[im 4/48]
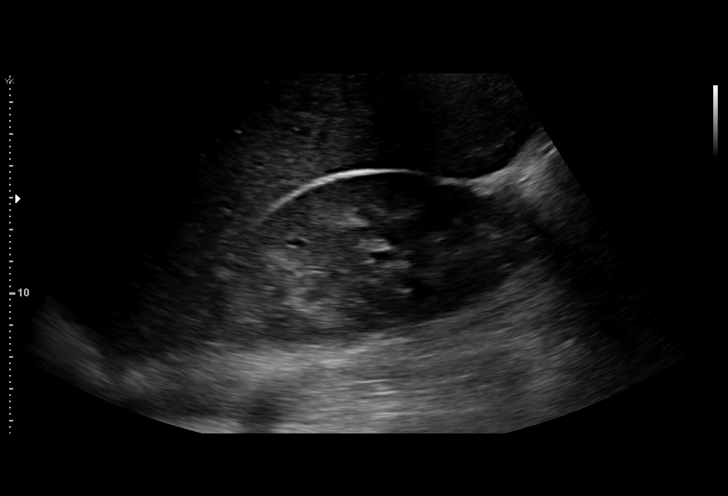
[im 8/48]
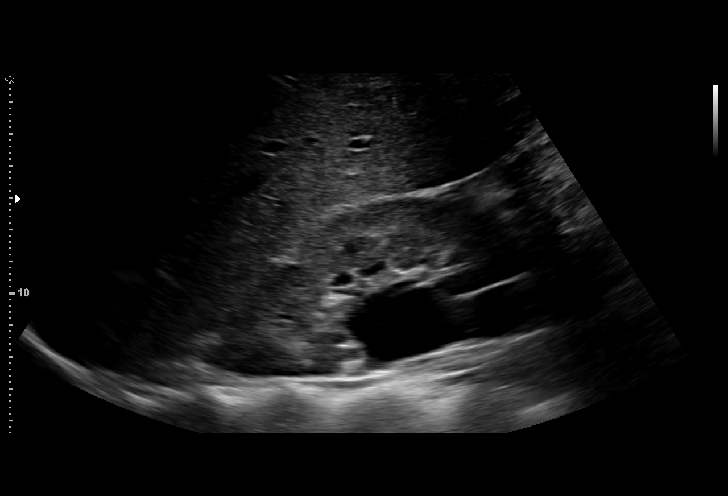
[im 10/48]
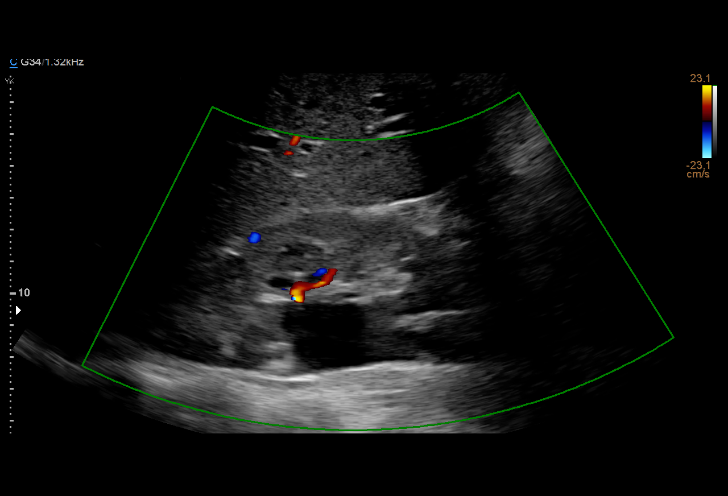
[im 14/48]
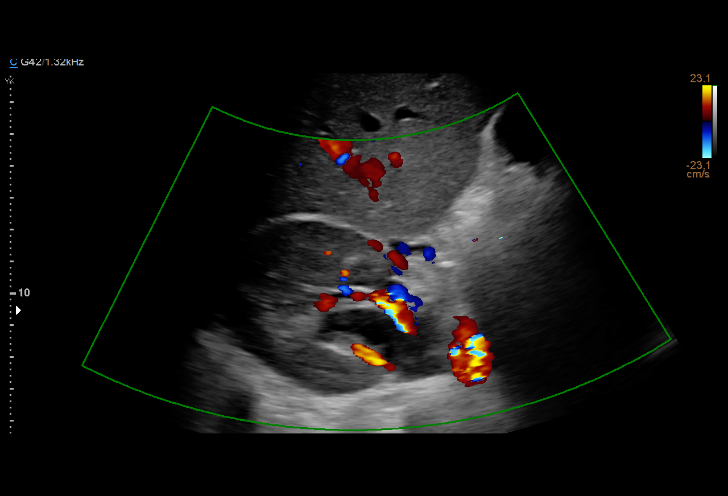
[im 18/48]
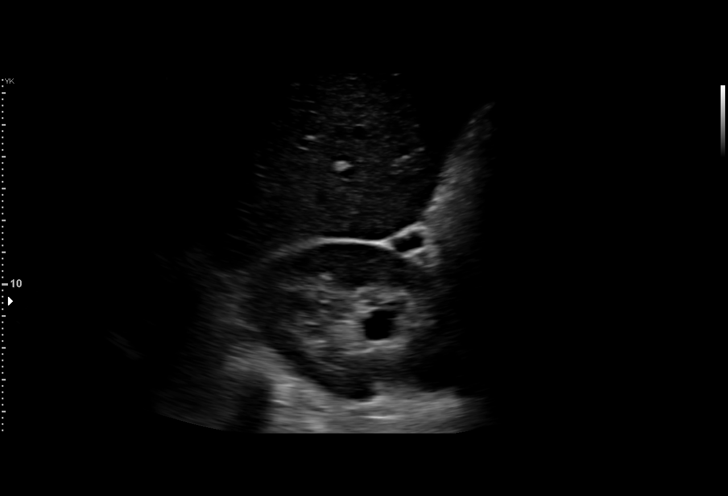
[im 20/48]
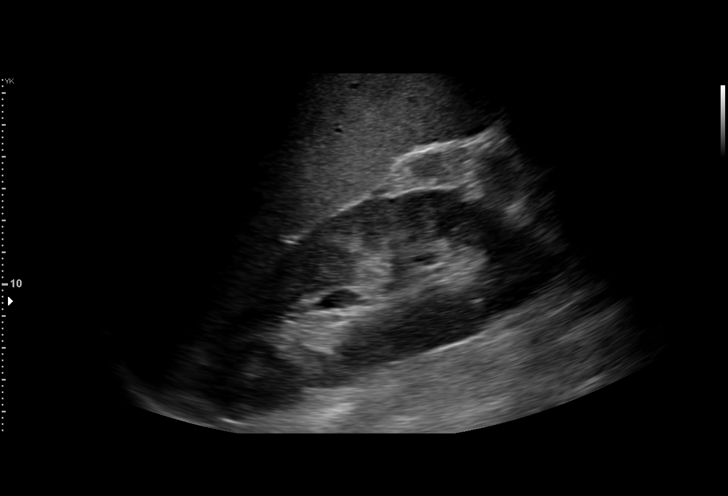
[im 24/48]
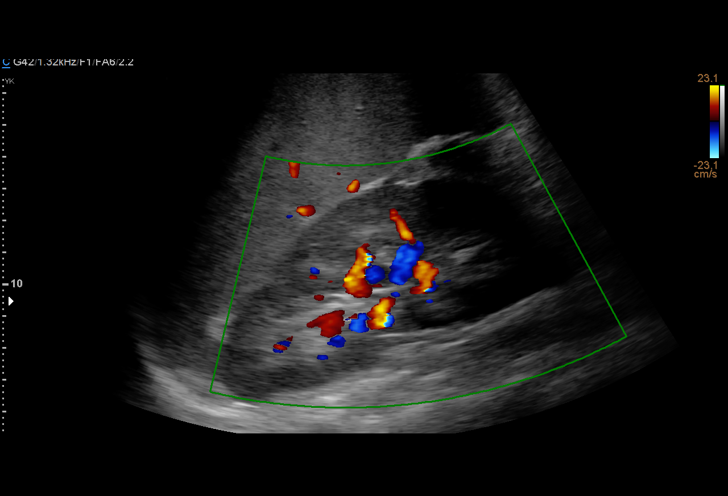
[im 28/48]
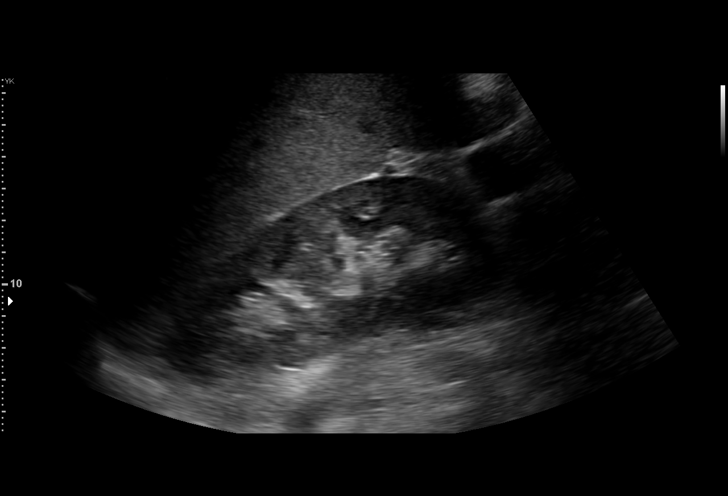
[im 30/48]
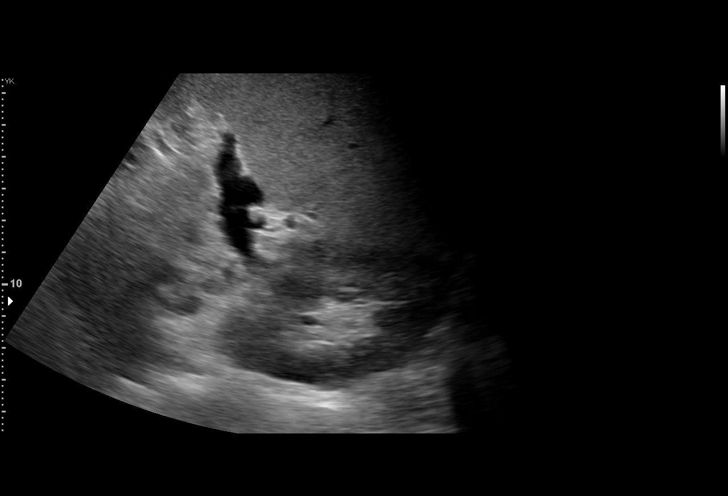
[im 34/48]
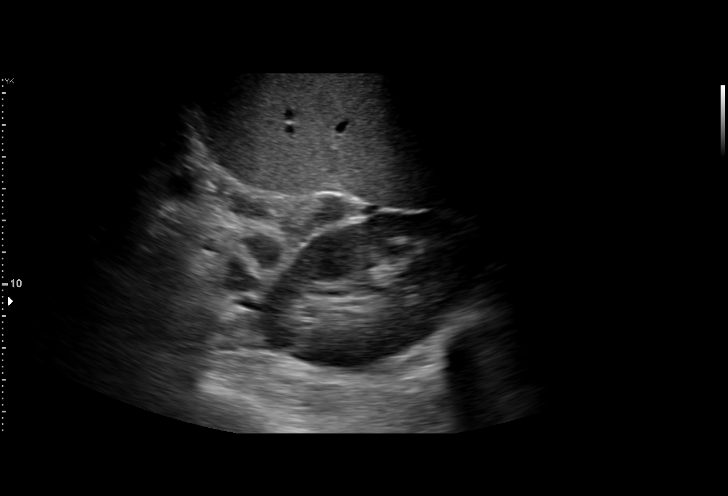
[im 38/48]
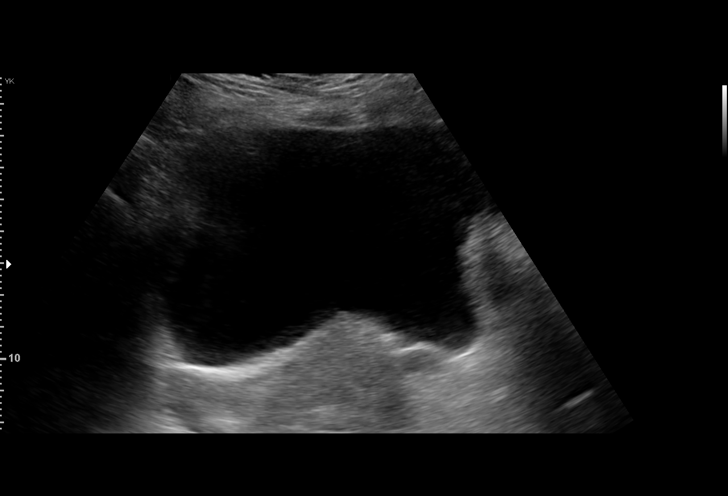
[im 40/48]
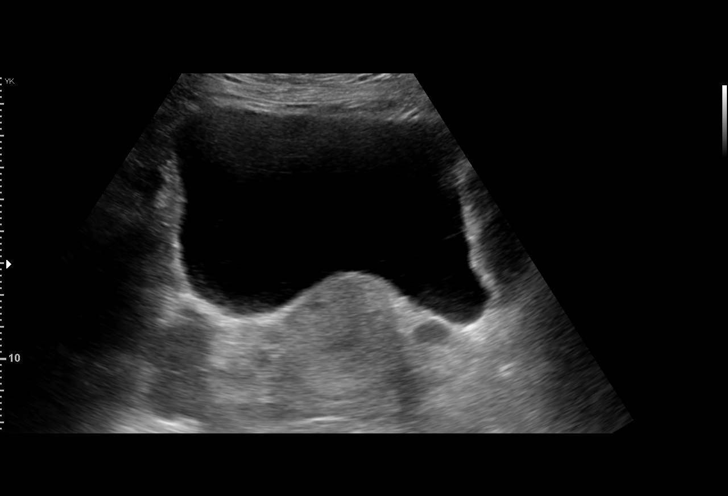
[im 44/48]
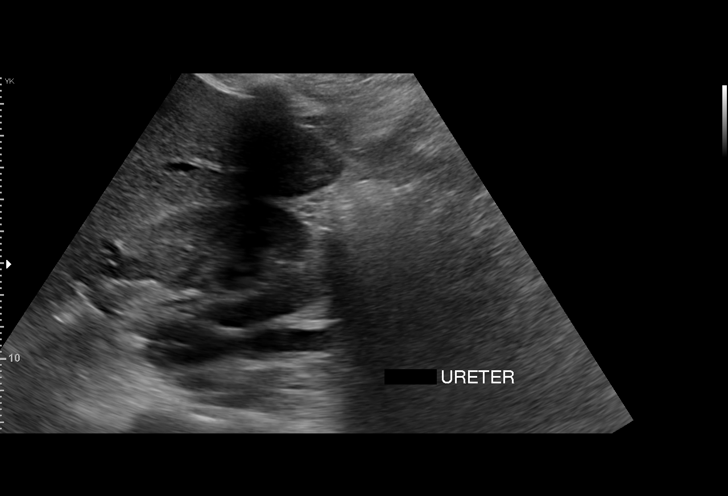
[im 48/48]
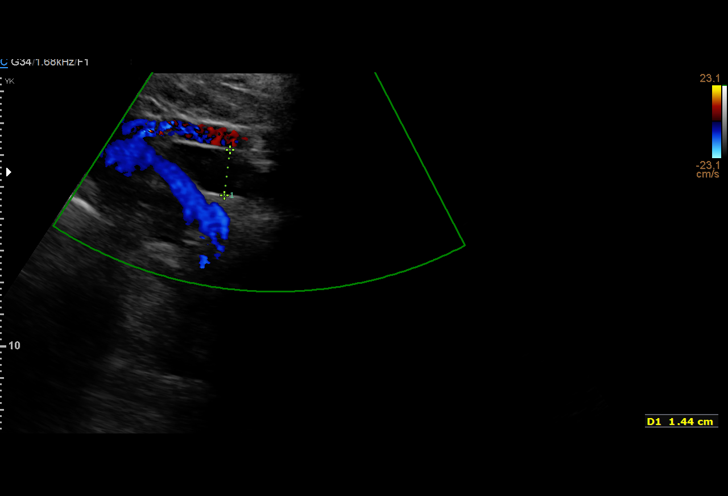

[15 of 25 positions shown; findings below may reference images not displayed]

FINDINGS: Right Kidney:

Length: 11.5 cm. Normal cortical thickness and echogenicity.
Moderate RIGHT hydronephrosis present. Proximal RIGHT ureteral
dilatation up to 14 mm diameter. Mid ureter 13 mm diameter. Distal
ureter not adequately localized, obscured by bowel gas. No renal
mass or shadowing calcification.

Left Kidney:

Length: 11.4 cm. Normal cortical thickness and echogenicity. No
mass, hydronephrosis or shadowing calcification.

Bladder:

Appears normal for degree of bladder distention. No definite distal
RIGHT ureteral dilatation or shadowing UVJ calculus noted.
IMPRESSION: Moderate RIGHT hydronephrosis and prominent proximal to mid RIGHT
hydroureter of uncertain etiology.

Consider CT assessment.
